# Patient Record
Sex: Male | Born: 1985 | Race: White | Hispanic: No | Marital: Married | Smoking: Current every day smoker
Health system: Southern US, Community
[De-identification: ages and names within clinical notes are randomized; demographics above are authoritative.]

## PROBLEM LIST (undated history)

## (undated) DIAGNOSIS — J45909 Unspecified asthma, uncomplicated: Secondary | ICD-10-CM

## (undated) HISTORY — PX: NO PAST SURGERIES: SHX2092

---
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
## 2008-12-28 ENCOUNTER — Emergency Department (HOSPITAL_COMMUNITY): Admission: EM | Admit: 2008-12-28 | Discharge: 2008-12-28 | Payer: Self-pay | Admitting: Emergency Medicine

## 2010-05-19 LAB — COMPREHENSIVE METABOLIC PANEL
AST: 88 U/L — ABNORMAL HIGH (ref 0–37)
Albumin: 3.5 g/dL (ref 3.5–5.2)
Alkaline Phosphatase: 90 U/L (ref 39–117)
BUN: 13 mg/dL (ref 6–23)
CO2: 24 mEq/L (ref 19–32)
Chloride: 98 mEq/L (ref 96–112)
Creatinine, Ser: 1.11 mg/dL (ref 0.4–1.5)
GFR calc non Af Amer: >60 mL/min (ref >60)
Potassium: 3.1 mEq/L — ABNORMAL LOW (ref 3.5–5.1)
Total Bilirubin: 0.9 mg/dL (ref 0.3–1.2)

## 2010-05-19 LAB — URINALYSIS, ROUTINE W REFLEX MICROSCOPIC
Leukocytes, UA: NEGATIVE
Nitrite: NEGATIVE
Protein, ur: >300 mg/dL — AB
Urobilinogen, UA: 1 mg/dL (ref 0.0–1.0)

## 2010-05-19 LAB — DIFFERENTIAL
Basophils Absolute: 0.1 10*3/uL (ref 0.0–0.1)
Basophils Relative: 2 % — ABNORMAL HIGH (ref 0–1)
Eosinophils Relative: 0 % (ref 0–5)
Lymphocytes Relative: 13 % (ref 12–46)
Monocytes Absolute: 0.3 10*3/uL (ref 0.1–1.0)
Neutro Abs: 4.1 10*3/uL (ref 1.7–7.7)

## 2010-05-19 LAB — CBC
HCT: 41.9 % (ref 39.0–52.0)
MCV: 85.2 fL (ref 78.0–100.0)
Platelets: 137 10*3/uL — ABNORMAL LOW (ref 150–400)
RBC: 4.92 MIL/uL (ref 4.22–5.81)
WBC: 5.1 10*3/uL (ref 4.0–10.5)

## 2010-05-19 LAB — RAPID STREP SCREEN (MED CTR MEBANE ONLY): Streptococcus, Group A Screen (Direct): NEGATIVE

## 2010-05-19 LAB — URINE MICROSCOPIC-ADD ON

## 2010-05-19 LAB — URINE CULTURE

## 2020-06-01 ENCOUNTER — Emergency Department (HOSPITAL_COMMUNITY): Payer: Self-pay

## 2020-06-01 ENCOUNTER — Encounter (HOSPITAL_COMMUNITY): Payer: Self-pay

## 2020-06-01 ENCOUNTER — Emergency Department (HOSPITAL_COMMUNITY)
Admission: EM | Admit: 2020-06-01 | Discharge: 2020-06-01 | Disposition: A | Discharge status: Home / Self Care | Payer: Self-pay | Attending: Emergency Medicine | Admitting: Emergency Medicine

## 2020-06-01 DIAGNOSIS — R000 Tachycardia, unspecified: Secondary | ICD-10-CM | POA: Insufficient documentation

## 2020-06-01 DIAGNOSIS — L089 Local infection of the skin and subcutaneous tissue, unspecified: Secondary | ICD-10-CM | POA: Insufficient documentation

## 2020-06-01 DIAGNOSIS — M7989 Other specified soft tissue disorders: Secondary | ICD-10-CM | POA: Insufficient documentation

## 2020-06-01 DIAGNOSIS — F17200 Nicotine dependence, unspecified, uncomplicated: Secondary | ICD-10-CM | POA: Insufficient documentation

## 2020-06-01 LAB — CBC WITH DIFFERENTIAL/PLATELET
Abs Immature Granulocytes: 0.02 10*3/uL (ref 0.00–0.07)
Basophils Absolute: 0 10*3/uL (ref 0.0–0.1)
Basophils Relative: 0 %
Eosinophils Absolute: 0.1 10*3/uL (ref 0.0–0.5)
Eosinophils Relative: 1 %
HCT: 43 % (ref 39.0–52.0)
Hemoglobin: 14.5 g/dL (ref 13.0–17.0)
Immature Granulocytes: 0 %
Lymphocytes Relative: 23 %
Lymphs Abs: 2.2 10*3/uL (ref 0.7–4.0)
MCH: 29.2 pg (ref 26.0–34.0)
MCHC: 33.7 g/dL (ref 30.0–36.0)
MCV: 86.5 fL (ref 80.0–100.0)
Monocytes Absolute: 0.6 10*3/uL (ref 0.1–1.0)
Monocytes Relative: 6 %
Neutro Abs: 6.7 10*3/uL (ref 1.7–7.7)
Neutrophils Relative %: 70 %
Platelets: 252 10*3/uL (ref 150–400)
RBC: 4.97 MIL/uL (ref 4.22–5.81)
RDW: 14.2 % (ref 11.5–15.5)
WBC: 9.6 10*3/uL (ref 4.0–10.5)
nRBC: 0 % (ref 0.0–0.2)

## 2020-06-01 LAB — BASIC METABOLIC PANEL
Anion gap: 10 (ref 5–15)
BUN: 10 mg/dL (ref 6–20)
CO2: 24 mmol/L (ref 22–32)
Calcium: 8.9 mg/dL (ref 8.9–10.3)
Chloride: 103 mmol/L (ref 98–111)
Creatinine, Ser: 0.85 mg/dL (ref 0.61–1.24)
GFR, Estimated: >60 mL/min (ref >60)
Glucose, Bld: 112 mg/dL — ABNORMAL HIGH (ref 70–99)
Potassium: 4.1 mmol/L (ref 3.5–5.1)
Sodium: 137 mmol/L (ref 135–145)

## 2020-06-01 LAB — LACTIC ACID, PLASMA: Lactic Acid, Venous: 1.6 mmol/L (ref 0.5–1.9)

## 2020-06-01 LAB — SEDIMENTATION RATE: Sed Rate: 22 mm/hr — ABNORMAL HIGH (ref 0–16)

## 2020-06-01 LAB — C-REACTIVE PROTEIN: CRP: 8.4 mg/dL — ABNORMAL HIGH (ref <1.0)

## 2020-06-01 MED ORDER — CEFAZOLIN SODIUM-DEXTROSE 2-4 GM/100ML-% IV SOLN
2.0000 g | Freq: Once | INTRAVENOUS | Status: AC
  Administered 2020-06-01: 2 g via INTRAVENOUS
  Filled 2020-06-01 (×2): qty 100

## 2020-06-01 MED ORDER — CEPHALEXIN 500 MG PO CAPS: 500.0000 mg | ORAL_CAPSULE | Freq: Four times a day (QID) | ORAL | 0 refills | Status: AC

## 2020-06-01 NOTE — ED Notes (Signed)
Per Tanda Rockers, PA second lactic blood draw not needed.

## 2020-06-01 NOTE — ED Triage Notes (Signed)
Pt presents with c/o cut on his right index finger that opened up last night. Pt reports he originally got the cut approx week ago. Pt reports that he woke up this morning and his right hand is swollen.

## 2020-06-01 NOTE — ED Provider Notes (Signed)
[facility]-EMERGENCY DEPT Provider Note   CSN: [id number] Arrival date & time: 06/01/20  1201     History Chief Complaint  Patient presents with  . Hand swelling    Daniel Galvan is a 35 y.o. male who presents to the ED today with complaint of gradual onset, constant, worsening, right index finger swelling that he noticed this morning.  She reports that about 1 week ago he accidentally cut his right index finger on a glass bottle.  He states that the wound scabbed over and he did not think much of it however yesterday he hit his hand against a door and opened up the wound.  He states that it was bleeding slightly.  When he woke up this morning he had diffuse swelling to the finger going into the hand with redness.  He states that the wound was draining a small amount of yellow material however he is unsure if it was purulent or thin the yellow and drainage.  He denies any fevers or chills.  He denies any history of diabetes.  He has no known medical problems.   The history is provided by the patient and medical records.       History reviewed. No pertinent past medical history.  There are no problems to display for this patient.   History reviewed. No pertinent surgical history.     History reviewed. No pertinent family history.  Social History   Tobacco Use  . Smoking status: Current Every Day Smoker  . Smokeless tobacco: Never Used  Substance Use Topics  . Alcohol use: Yes    Comment: rarely     Home Medications Prior to Admission medications   Medication Sig Start Date End Date Taking? Authorizing Provider  cephALEXin (KEFLEX) 500 MG capsule Take 1 capsule (500 mg total) by mouth 4 (four) times daily for 7 days. 06/01/20 06/08/20 Yes Ravi Tuccillo, PA-C    Allergies    Patient has no known allergies.  Review of Systems   Review of Systems  Constitutional: Negative for chills and fever.  Musculoskeletal: Positive for arthralgias.  Skin:  Positive for color change and wound.  All other systems reviewed and are negative.   Physical Exam Updated Vital Signs BP (!) 142/109 (BP Location: Left Arm)   Pulse (!) 105   Temp 99 F (37.2 C) (Oral)   Resp 18   Ht 6\' 2"  (1.88 m)   Wt 124.7 kg   SpO2 98%   BMI 35.31 kg/m   Physical Exam Vitals and nursing note reviewed.  Constitutional:      Appearance: He is not ill-appearing.  HENT:     Head: Normocephalic and atraumatic.  Eyes:     Conjunctiva/sclera: Conjunctivae normal.  Cardiovascular:     Rate and Rhythm: Regular rhythm. Tachycardia present.  Pulmonary:     Effort: Pulmonary effort is normal.     Breath sounds: Normal breath sounds. No wheezing, rhonchi or rales.  Abdominal:     Palpations: Abdomen is soft.     Tenderness: There is no abdominal tenderness.  Musculoskeletal:     Cervical back: Neck supple.     Comments: See photo below. 1 x 1 cm open wound to the PIP joint of the right index finger with purulent material. Erythema surrounding the wound with fusiform edema to the index finger extending into the hand. ROM limited with flexion and extension of the entire finger s/2 swelling. Cap refill < 2 seconds. 2+ radial pulse.  Skin:    General: Skin is warm and dry.  Neurological:     Mental Status: He is alert.        ED Results / Procedures / Treatments   Labs (all labs ordered are listed, but only abnormal results are displayed) Labs Reviewed  BASIC METABOLIC PANEL - Abnormal; Notable for the following components:      Result Value   Glucose, Bld 112 (*)    All other components within normal limits  SEDIMENTATION RATE - Abnormal; Notable for the following components:   Sed Rate 22 (*)    All other components within normal limits  C-REACTIVE PROTEIN - Abnormal; Notable for the following components:   CRP 8.4 (*)    All other components within normal limits  CBC WITH DIFFERENTIAL/PLATELET  LACTIC ACID, PLASMA    EKG None  Radiology DG  Finger Index Right  Result Date: 06/01/2020 CLINICAL DATA:  Laceration with swelling and drainage. Injury 1 week ago. EXAM: RIGHT INDEX FINGER 2+V COMPARISON:  None. FINDINGS: Pronounced dorsal soft tissue swelling particularly in the region of the PIP joint. No sign of radiopaque foreign object. No bone abnormality visible. IMPRESSION: Pronounced dorsal soft tissue swelling particularly in the region of the PIP joint. No sign of radiopaque foreign object or fracture. Electronically Signed   By: Paulina Fusi M.D.   On: 06/01/2020 13:00    Procedures Procedures   Medications Ordered in ED Medications  ceFAZolin (ANCEF) IVPB 2g/100 mL premix (2 g Intravenous New Bag/Given 06/01/20 1429)    ED Course  I have reviewed the triage vital signs and the nursing notes.  Pertinent labs & imaging results that were available during my care of the patient were reviewed by me and considered in my medical decision making (see chart for details).  Clinical Course as of 06/01/20 1645  Mon Jun 01, 2020  1335 Discussed case with Earney Hamburg, PA-C, who recommends irrigating wounds and provided IV ancef 2 grams with Rx abx and follow up with Dr. Melvyn Novas in the office this week. Recommends splint for comfort.  [MV]    Clinical Course User Index [MV] Tanda Rockers, PA-C   MDM Rules/Calculators/A&P                          35 year old male presents to the ED today with complaint of swelling, pain, wound to his right index finger.  Sustained a laceration 1 week ago however hit his hand on a door yesterday opening the wound back open and now having swelling and drainage to the area.  On arrival to the ED patient's temperature is 9.0.  He is mildly tachycardic at 105, nontachypneic.  He is nontoxic appearing.  He has a 1 x 1 cm open wound to the PIP joint of the right index finger dorsally.  He has fusiform swelling to the finger with limited range of motion.  He has tenderness throughout  Concern for possible  flexor tenosynovitis at this time.  Will obtain labs as well as x-ray.  Will need to consult hand surgery for further recommendations.  Xray: IMPRESSION:  Pronounced dorsal soft tissue swelling particularly in the region of  the PIP joint. No sign of radiopaque foreign object or fracture.    CBC without leukocytosis 9.6 BMP without electrolyte abnormalities Lactic acid within normal limits at 1.6 Sed rate 22 CRP 8.4  Wound has been irrigated thoroughly by nursing staff per my recommendations. Pt received IV ancef  and splint applied. Will discharge home at this time with oral abx and close follow up with Dr. Melvyn Novas. Pt is in agreement with plan and stable for discharge home.   This note was prepared using Dragon voice recognition software and may include unintentional dictation errors due to the inherent limitations of voice recognition software.    Final Clinical Impression(s) / ED Diagnoses Final diagnoses:  Finger infection    Rx / DC Orders ED Discharge Orders         Ordered    cephALEXin (KEFLEX) 500 MG capsule  4 times daily        06/01/20 1644           Discharge Instructions     Please pick up antibiotics and take as prescribed to cover for your infection. Call Dr. Glenna Durand office tomorrow morning to schedule an appointment for further evaluation. Wear splint in the meantime.   Take Ibuprofen and Tylenol as needed for pain.   Return to the ED for any worsening symptoms       Tanda Rockers, PA-C 06/01/20 1645    Derwood Kaplan, MD 06/02/20 1733

## 2020-06-01 NOTE — Progress Notes (Signed)
Orthopedic Tech Progress Note Patient Details:  OMER PUCCINELLI 1985/11/28 [id number]  Ortho Devices Type of Ortho Device: Finger splint Ortho Device/Splint Location: right Ortho Device/Splint Interventions: Application   Post Interventions Patient Tolerated: Well Instructions Provided: Care of device   Saul Fordyce 06/01/2020, 4:56 PM

## 2020-06-01 NOTE — ED Notes (Signed)
An After Visit Summary was printed and given to the patient. Discharge instructions given and no further questions at this time.  

## 2020-06-01 NOTE — ED Notes (Signed)
Wound to right hand has been irrigated per Tanda Rockers, PA.

## 2020-06-01 NOTE — Discharge Instructions (Signed)
Please pick up antibiotics and take as prescribed to cover for your infection. Call Dr. Glenna Durand office tomorrow morning to schedule an appointment for further evaluation. Wear splint in the meantime.   Take Ibuprofen and Tylenol as needed for pain.   Return to the ED for any worsening symptoms

## 2020-07-28 ENCOUNTER — Other Ambulatory Visit: Payer: Self-pay

## 2020-07-28 ENCOUNTER — Encounter (HOSPITAL_COMMUNITY): Payer: Self-pay | Admitting: Orthopedic Surgery

## 2020-07-28 NOTE — Progress Notes (Signed)
Mr. Daniel Galvan denies chest pain or shortness of breath.  Patient denies s/s of Covid for himself or house hold. [name] denies being in contact with anyone with Covid to his knowledge.  When Mr. Daniel Galvan was in the ED 4/ /22 his blood pressure was documented at 142/109. Mr. Daniel Galvan reports that he has been told in the past that it was a little high, never started on medication, patient does not have a PCP.

## 2020-07-29 ENCOUNTER — Encounter (HOSPITAL_COMMUNITY)
Admission: RE | Disposition: A | Discharge status: Home / Self Care | Payer: Self-pay | Source: Home / Self Care | Attending: Orthopedic Surgery

## 2020-07-29 ENCOUNTER — Ambulatory Visit (HOSPITAL_COMMUNITY): Payer: Self-pay | Admitting: Anesthesiology

## 2020-07-29 ENCOUNTER — Ambulatory Visit (HOSPITAL_COMMUNITY)
Admission: RE | Admit: 2020-07-29 | Discharge: 2020-07-29 | Disposition: A | Discharge status: Home / Self Care | Payer: Self-pay | Attending: Orthopedic Surgery | Admitting: Orthopedic Surgery

## 2020-07-29 ENCOUNTER — Encounter (HOSPITAL_COMMUNITY): Payer: Self-pay | Admitting: Orthopedic Surgery

## 2020-07-29 DIAGNOSIS — F17200 Nicotine dependence, unspecified, uncomplicated: Secondary | ICD-10-CM | POA: Insufficient documentation

## 2020-07-29 DIAGNOSIS — M869 Osteomyelitis, unspecified: Secondary | ICD-10-CM | POA: Insufficient documentation

## 2020-07-29 HISTORY — DX: Unspecified asthma, uncomplicated: J45.909

## 2020-07-29 HISTORY — PX: INCISION AND DRAINAGE: SHX5863

## 2020-07-29 LAB — POCT I-STAT, CHEM 8
BUN: 12 mg/dL (ref 6–20)
Calcium, Ion: 1.09 mmol/L — ABNORMAL LOW (ref 1.15–1.40)
Chloride: 106 mmol/L (ref 98–111)
Creatinine, Ser: 0.8 mg/dL (ref 0.61–1.24)
Glucose, Bld: 96 mg/dL (ref 70–99)
HCT: 40 % (ref 39.0–52.0)
Hemoglobin: 13.6 g/dL (ref 13.0–17.0)
Potassium: 5.6 mmol/L — ABNORMAL HIGH (ref 3.5–5.1)
Sodium: 138 mmol/L (ref 135–145)
TCO2: 26 mmol/L (ref 22–32)

## 2020-07-29 SURGERY — INCISION AND DRAINAGE
Anesthesia: General | Laterality: Right

## 2020-07-29 MED ORDER — FENTANYL CITRATE (PF) 100 MCG/2ML IJ SOLN: 25.0000 ug | INTRAMUSCULAR | Status: DC | PRN

## 2020-07-29 MED ORDER — ORAL CARE MOUTH RINSE: 15.0000 mL | Freq: Once | OROMUCOSAL | Status: AC

## 2020-07-29 MED ORDER — MIDAZOLAM HCL 2 MG/2ML IJ SOLN
INTRAMUSCULAR | Status: AC
  Filled 2020-07-29: qty 2

## 2020-07-29 MED ORDER — MIDAZOLAM HCL 5 MG/5ML IJ SOLN
INTRAMUSCULAR | Status: DC | PRN
  Administered 2020-07-29: 2 mg via INTRAVENOUS

## 2020-07-29 MED ORDER — ONDANSETRON HCL 4 MG/2ML IJ SOLN
INTRAMUSCULAR | Status: DC | PRN
  Administered 2020-07-29: 4 mg via INTRAVENOUS

## 2020-07-29 MED ORDER — PROPOFOL 10 MG/ML IV BOLUS
INTRAVENOUS | Status: AC
  Filled 2020-07-29: qty 20

## 2020-07-29 MED ORDER — LIDOCAINE HCL (CARDIAC) PF 100 MG/5ML IV SOSY
PREFILLED_SYRINGE | INTRAVENOUS | Status: DC | PRN
  Administered 2020-07-29: 60 mg via INTRAVENOUS

## 2020-07-29 MED ORDER — FENTANYL CITRATE (PF) 250 MCG/5ML IJ SOLN
INTRAMUSCULAR | Status: AC
  Filled 2020-07-29: qty 5

## 2020-07-29 MED ORDER — LIDOCAINE HCL (PF) 2 % IJ SOLN
INTRAMUSCULAR | Status: AC
  Filled 2020-07-29: qty 5

## 2020-07-29 MED ORDER — LACTATED RINGERS IV SOLN: INTRAVENOUS | Status: DC

## 2020-07-29 MED ORDER — ONDANSETRON HCL 4 MG/2ML IJ SOLN
INTRAMUSCULAR | Status: AC
  Filled 2020-07-29: qty 2

## 2020-07-29 MED ORDER — DEXAMETHASONE SODIUM PHOSPHATE 10 MG/ML IJ SOLN
INTRAMUSCULAR | Status: AC
  Filled 2020-07-29: qty 1

## 2020-07-29 MED ORDER — OXYCODONE HCL 5 MG PO TABS: 5.0000 mg | ORAL_TABLET | Freq: Once | ORAL | Status: DC | PRN

## 2020-07-29 MED ORDER — OXYCODONE HCL 5 MG/5ML PO SOLN: 5.0000 mg | Freq: Once | ORAL | Status: DC | PRN

## 2020-07-29 MED ORDER — BUPIVACAINE HCL (PF) 0.25 % IJ SOLN
INTRAMUSCULAR | Status: AC
  Filled 2020-07-29: qty 10

## 2020-07-29 MED ORDER — ONDANSETRON HCL 4 MG/2ML IJ SOLN: 4.0000 mg | Freq: Once | INTRAMUSCULAR | Status: DC | PRN

## 2020-07-29 MED ORDER — FENTANYL CITRATE (PF) 250 MCG/5ML IJ SOLN
INTRAMUSCULAR | Status: DC | PRN
  Administered 2020-07-29: 50 ug via INTRAVENOUS
  Administered 2020-07-29: 100 ug via INTRAVENOUS

## 2020-07-29 MED ORDER — DEXAMETHASONE SODIUM PHOSPHATE 10 MG/ML IJ SOLN
INTRAMUSCULAR | Status: DC | PRN
  Administered 2020-07-29: 4 mg via INTRAVENOUS

## 2020-07-29 MED ORDER — CHLORHEXIDINE GLUCONATE 0.12 % MT SOLN
OROMUCOSAL | Status: AC
  Administered 2020-07-29: 15 mL
  Filled 2020-07-29: qty 15

## 2020-07-29 MED ORDER — CHLORHEXIDINE GLUCONATE 0.12 % MT SOLN: 15.0000 mL | Freq: Once | OROMUCOSAL | Status: AC

## 2020-07-29 MED ORDER — PROPOFOL 10 MG/ML IV BOLUS
INTRAVENOUS | Status: DC | PRN
  Administered 2020-07-29: 200 mg via INTRAVENOUS

## 2020-07-29 MED ORDER — BUPIVACAINE HCL (PF) 0.25 % IJ SOLN
INTRAMUSCULAR | Status: DC | PRN
  Administered 2020-07-29: 9 mL

## 2020-07-29 MED ORDER — CEFAZOLIN IN SODIUM CHLORIDE 3-0.9 GM/100ML-% IV SOLN
3.0000 g | INTRAVENOUS | Status: DC
  Filled 2020-07-29: qty 100

## 2020-07-29 MED ORDER — SODIUM CHLORIDE 0.9 % IR SOLN
Status: DC | PRN
  Administered 2020-07-29: 3000 mL

## 2020-07-29 SURGICAL SUPPLY — 55 items
BNDG COHESIVE 1X5 TAN STRL LF (GAUZE/BANDAGES/DRESSINGS) ×3 IMPLANT
BNDG CONFORM 2 STRL LF (GAUZE/BANDAGES/DRESSINGS) ×3 IMPLANT
BNDG ELASTIC 3X5.8 VLCR STR LF (GAUZE/BANDAGES/DRESSINGS) ×3 IMPLANT
BNDG ELASTIC 4X5.8 VLCR STR LF (GAUZE/BANDAGES/DRESSINGS) ×3 IMPLANT
BNDG ESMARK 4X9 LF (GAUZE/BANDAGES/DRESSINGS) ×3 IMPLANT
BNDG GAUZE ELAST 4 BULKY (GAUZE/BANDAGES/DRESSINGS) ×3 IMPLANT
CORD BIPOLAR FORCEPS 12FT (ELECTRODE) ×3 IMPLANT
COVER SURGICAL LIGHT HANDLE (MISCELLANEOUS) ×3 IMPLANT
COVER WAND RF STERILE (DRAPES) ×3 IMPLANT
CUFF TOURN SGL QUICK 18X4 (TOURNIQUET CUFF) ×3 IMPLANT
CUFF TOURN SGL QUICK 24 (TOURNIQUET CUFF)
CUFF TRNQT CYL 24X4X16.5-23 (TOURNIQUET CUFF) IMPLANT
DRAIN PENROSE 1/4X12 LTX STRL (WOUND CARE) IMPLANT
DRAPE SURG 17X23 STRL (DRAPES) ×3 IMPLANT
DRSG ADAPTIC 3X8 NADH LF (GAUZE/BANDAGES/DRESSINGS) ×3 IMPLANT
ELECT REM PT RETURN 9FT ADLT (ELECTROSURGICAL)
ELECTRODE REM PT RTRN 9FT ADLT (ELECTROSURGICAL) IMPLANT
GAUZE SPONGE 4X4 12PLY STRL (GAUZE/BANDAGES/DRESSINGS) ×3 IMPLANT
GAUZE XEROFORM 1X8 LF (GAUZE/BANDAGES/DRESSINGS) ×3 IMPLANT
GAUZE XEROFORM 5X9 LF (GAUZE/BANDAGES/DRESSINGS) IMPLANT
GLOVE BIOGEL PI IND STRL 8.5 (GLOVE) ×1 IMPLANT
GLOVE BIOGEL PI INDICATOR 8.5 (GLOVE) ×2
GLOVE SURG ORTHO 8.0 STRL STRW (GLOVE) ×3 IMPLANT
GOWN STRL REUS W/ TWL LRG LVL3 (GOWN DISPOSABLE) ×3 IMPLANT
GOWN STRL REUS W/ TWL XL LVL3 (GOWN DISPOSABLE) ×1 IMPLANT
GOWN STRL REUS W/TWL LRG LVL3 (GOWN DISPOSABLE) ×6
GOWN STRL REUS W/TWL XL LVL3 (GOWN DISPOSABLE) ×3
HANDPIECE INTERPULSE COAX TIP (DISPOSABLE)
KIT BASIN OR (CUSTOM PROCEDURE TRAY) ×3 IMPLANT
KIT TURNOVER KIT B (KITS) ×3 IMPLANT
MANIFOLD NEPTUNE II (INSTRUMENTS) ×3 IMPLANT
NEEDLE HYPO 25GX1X1/2 BEV (NEEDLE) IMPLANT
NS IRRIG 1000ML POUR BTL (IV SOLUTION) ×3 IMPLANT
PACK ORTHO EXTREMITY (CUSTOM PROCEDURE TRAY) ×3 IMPLANT
PAD ARMBOARD 7.5X6 YLW CONV (MISCELLANEOUS) ×6 IMPLANT
PAD CAST 4YDX4 CTTN HI CHSV (CAST SUPPLIES) ×1 IMPLANT
PADDING CAST COTTON 4X4 STRL (CAST SUPPLIES) ×3
SET CYSTO W/LG BORE CLAMP LF (SET/KITS/TRAYS/PACK) IMPLANT
SET HNDPC FAN SPRY TIP SCT (DISPOSABLE) IMPLANT
SOAP 2 % CHG 4 OZ (WOUND CARE) ×3 IMPLANT
SPONGE LAP 18X18 RF (DISPOSABLE) ×3 IMPLANT
SPONGE LAP 4X18 RFD (DISPOSABLE) ×3 IMPLANT
SUT ETHILON 4 0 PS 2 18 (SUTURE) IMPLANT
SUT ETHILON 5 0 P 3 18 (SUTURE)
SUT NYLON ETHILON 5-0 P-3 1X18 (SUTURE) IMPLANT
SWAB COLLECTION DEVICE MRSA (MISCELLANEOUS) ×3 IMPLANT
SWAB CULTURE ESWAB REG 1ML (MISCELLANEOUS) IMPLANT
SYR CONTROL 10ML LL (SYRINGE) IMPLANT
TOWEL GREEN STERILE (TOWEL DISPOSABLE) ×3 IMPLANT
TOWEL GREEN STERILE FF (TOWEL DISPOSABLE) ×3 IMPLANT
TUBE CONNECTING 12'X1/4 (SUCTIONS) ×1
TUBE CONNECTING 12X1/4 (SUCTIONS) ×2 IMPLANT
UNDERPAD 30X36 HEAVY ABSORB (UNDERPADS AND DIAPERS) ×3 IMPLANT
WATER STERILE IRR 1000ML POUR (IV SOLUTION) ×3 IMPLANT
YANKAUER SUCT BULB TIP NO VENT (SUCTIONS) ×3 IMPLANT

## 2020-07-29 NOTE — Anesthesia Preprocedure Evaluation (Signed)
Anesthesia Evaluation  Patient identified by MRN, date of birth, ID band Patient awake    Reviewed: Allergy & Precautions, NPO status , Patient's Chart, lab work & pertinent test results  Airway Mallampati: II  TM Distance: >3 FB Neck ROM: Full    Dental  (+) Teeth Intact, Dental Advisory Given   Pulmonary Current Smoker and Patient abstained from smoking.,    breath sounds clear to auscultation       Cardiovascular  Rhythm:Regular Rate:Normal     Neuro/Psych    GI/Hepatic   Endo/Other    Renal/GU      Musculoskeletal   Abdominal   Peds  Hematology   Anesthesia Other Findings   Reproductive/Obstetrics                             Anesthesia Physical Anesthesia Plan  ASA: 2  Anesthesia Plan: General   Post-op Pain Management:    Induction: Intravenous  PONV Risk Score and Plan: Ondansetron and Dexamethasone  Airway Management Planned: LMA  Additional Equipment:   Intra-op Plan:   Post-operative Plan: Extubation in OR  Informed Consent: I have reviewed the patients History and Physical, chart, labs and discussed the procedure including the risks, benefits and alternatives for the proposed anesthesia with the patient or authorized representative who has indicated his/her understanding and acceptance.     Dental advisory given  Plan Discussed with: CRNA and Anesthesiologist  Anesthesia Plan Comments:         Anesthesia Quick Evaluation

## 2020-07-29 NOTE — Op Note (Signed)
PREOPERATIVE DIAGNOSIS: Right index finger proximal interphalangeal joint infection  POSTOPERATIVE DIAGNOSIS: Same  ATTENDING SURGEON: Dr. Bradly Bienenstock who scrubbed and present for the entire procedure  ASSISTANT SURGEON: Lambert Mody, [facility] who scrubbed in necessary for exposure debridement closure and splinting in a timely fashion  ANESTHESIA: General via LMA  OPERATIVE PROCEDURE: #1: Open treatment of right index finger PIP joint infection joint arthrotomy and debridement #2: Right index finger debridement of bone bone curettage proximal middle phalangeal  IMPLANTS: None  EBL: Minimal  RADIOGRAPHIC INTERPRETATION: None  SURGICAL INDICATIONS: Patient is a right-hand-dominant gentleman who was referred from the ER due to persistent infection.  Patient is followed in the clinic endorsing approximately continue to progress to involve the PIP joint.  Patient was seen and evaluated and recommended undergo the above procedure.  Risks of surgery include not limited to bleeding infection damage nearby nerves arteries or tendons loss of motion of the wrist and digits incomplete relief of symptoms and need for further surgical invention.  SURGICAL TECHNIQUE: Patient was palpated find the preoperative holding area marked, marker made on the right index finger MP direct operative site.  Patient brought back to operating placed supine on anesthesia table where general anesthetic was administered.  Patient tolerates well.  Well-padded tourniquet placed on the right brachium seems appropriate drape.  Preoperative antibiotics were held prior to wound cultures.  The right upper extremity prepped and draped in normal sterile fashion.  A timeout was called the correct site identified procedure then begun.  Attention was then turned to the right index finger.  Limb was then elevated tourniquet insufflated.  Longitudinal incision made directly over the dorsal aspect of the index finger PIP joint.  Dissection  carried down through the skin and subcutaneous tissue.  The extensor interval was in continuity and this was incised longitudinally exposing the joint.  Joint arthrotomy was then carried out and debridement of the joint was then carried out synovectomy was carried out within the proximal interphalangeal joint with loss of the articular cartilage on both surfaces of the joint.  Patient did have the instability based on the loss of the joint surfaces to the PIP joint which appeared to be from a septic arthritis.  Debridement of the bone was then carried out the bone fragments were then sent for culture of the proximal and middle phalanges.  The joint was then thoroughly irrigated.  After synovectomy and bone.  Time and joint irrigation the extensor was loosely reapproximated with Monocryl suture.  Skin was then closed using simple Prolene suture.  Adaptic dressing sterile compressive bandage then applied.  Patient tolerated procedure well.  Patient placed in a small finger splint extubated taken recovery room in good condition.  POSTOPERATIVE PLAN: Patient be discharged home.  See him back in the office as an outpatient for wound check.  We will consult infectious disease about the treatment of the osteomyelitis or septic arthritis and long-term IV oral antibiotics.  Patient is likely going to need an arthrodesis of the PIP joint once the infection is cleared.  He had heals.  Patient does not have any articular cartilage on the proximal phalanx and very little in the middle phalanx.

## 2020-07-29 NOTE — Transfer of Care (Signed)
Immediate Anesthesia Transfer of Care Note  Patient: Daniel Galvan  Procedure(s) Performed: Right index finger incision and drainage and bone curettage (Right)  Patient Location: PACU  Anesthesia Type:General  Level of Consciousness: sedated  Airway & Oxygen Therapy: Patient Spontanous Breathing  Post-op Assessment: Report given to RN  Post vital signs: stable  Last Vitals:  Vitals Value Taken Time  BP 135/75 07/29/20 1436  Temp    Pulse 77 07/29/20 1437  Resp 23 07/29/20 1437  SpO2 94 % 07/29/20 1437  Vitals shown include unvalidated device data.  Last Pain:  Vitals:   07/29/20 1119  TempSrc:   PainSc: 0-No pain         Complications: No notable events documented.

## 2020-07-29 NOTE — H&P (Signed)
  Daniel Galvan is an 35 y.o. male.   Chief Complaint: RIGHT INDEX FINGER PAIN  HPI: The patient is a 35 y/o right hand dominance male show sustained a laceration to the right index finger approximately 8 weeks ago. An incision and drainage of the finger was completed on 06/04/20 and he was given antibiotics. During the post-operative healing, he continued to have swelling and deviation of the finger. Another course of antibiotics was completed with minimal improvement.  Due to the continued pain, weakness, numbness, swelling, and erythema, we discussed the reason and rationale for surgical intervention.  He is here today for surgery.  He denies chest pain, shortness of breath, fever, chills, nausea, vomiting, or diarrhea.    Past Medical History:  Diagnosis Date   Asthma     Past Surgical History:  Procedure Laterality Date   NO PAST SURGERIES      No family history on file. Social History:  reports that he has been smoking cigarettes. He has a 3.90 pack-year smoking history. He has never used smokeless tobacco. He reports current alcohol use. He reports previous drug use.  Allergies: No Known Allergies  No medications prior to admission.    No results found for this or any previous visit (from the past 48 hour(s)). No results found.  ROS NO RECENT ILLNESSES OR HOSPITALIZATIONS  Height 6\' 2"  (1.88 m), weight 124.7 kg. Physical Exam  General Appearance:  Alert, cooperative, no distress, appears stated age  Head:  Normocephalic, without obvious abnormality, atraumatic  Eyes:  Pupils equal, conjunctiva/corneas clear,         Throat: Lips, mucosa, and tongue normal; teeth and gums normal  Neck: No visible masses     Lungs:   respirations unlabored  Chest Wall:  No tenderness or deformity  Heart:  Regular rate and rhythm,  Abdomen:   Soft, non-tender,         Extremities: Right index large swelling over pip joint, finger warm well perfused Good cap refil limited motion  at pip joint  Pulses: 2+ and symmetric  Skin: Skin color, texture, turgor normal, no rashes or lesions     Neurologic: Normal     Assessment/Plan RIGHT INDEX FINGER OSTEOMYELITIS   - RIGHT INDEX FINGER INCISION AND DRAINAGE AND BONE CURRETAGE   WE ARE PLANNING SURGERY FOR YOUR UPPER EXTREMITY. THE RISKS AND BENEFITS OF SURGERY INCLUDE BUT NOT LIMITED TO BLEEDING INFECTION, DAMAGE TO NEARBY NERVES ARTERIES TENDONS, FAILURE OF SURGERY TO ACCOMPLISH ITS INTENDED GOALS, PERSISTENT SYMPTOMS AND NEED FOR FURTHER SURGICAL INTERVENTION. WITH THIS IN MIND WE WILL PROCEED. I HAVE DISCUSSED WITH THE PATIENT THE PRE AND POSTOPERATIVE REGIMEN AND THE DOS AND DON'TS. PT VOICED UNDERSTANDING AND INFORMED CONSENT SIGNED.   R/B/A DISCUSSED WITH PT IN OFFICE.  PT VOICED UNDERSTANDING OF PLAN CONSENT SIGNED DAY OF SURGERY PT SEEN AND EXAMINED PRIOR TO OPERATIVE PROCEDURE/DAY OF SURGERY SITE MARKED. QUESTIONS ANSWERED WILL GO HOME FOLLOWING SURGERY   Halee Glynn [facility] 07/29/20  07/31/20 07/29/2020, 10:11 AM

## 2020-07-29 NOTE — Anesthesia Procedure Notes (Signed)
Procedure Name: LMA Insertion Date/Time: 07/29/2020 1:54 PM Performed by: Oliva Bustard, CRNA Pre-anesthesia Checklist: Patient identified, Patient being monitored, Timeout performed, Emergency Drugs available and Suction available Patient Re-evaluated:Patient Re-evaluated prior to induction Oxygen Delivery Method: Circle system utilized Preoxygenation: Pre-oxygenation with 100% oxygen Induction Type: IV induction Ventilation: Mask ventilation without difficulty LMA: LMA inserted LMA Size: 5.0 Tube type: Oral Number of attempts: 1 Placement Confirmation: positive ETCO2 and breath sounds checked- equal and bilateral Tube secured with: Tape Dental Injury: Teeth and Oropharynx as per pre-operative assessment

## 2020-07-29 NOTE — Anesthesia Postprocedure Evaluation (Signed)
Anesthesia Post Note  Patient: Daniel Galvan  Procedure(s) Performed: Right index finger incision and drainage and bone curettage (Right)     Patient location during evaluation: PACU Anesthesia Type: General Level of consciousness: awake and alert Pain management: pain level controlled Vital Signs Assessment: post-procedure vital signs reviewed and stable Respiratory status: spontaneous breathing, nonlabored ventilation, respiratory function stable and patient connected to nasal cannula oxygen Cardiovascular status: blood pressure returned to baseline and stable Postop Assessment: no apparent nausea or vomiting Anesthetic complications: no   No notable events documented.  Last Vitals:  Vitals:   07/29/20 1451 07/29/20 1506  BP: (!) 147/91 131/71  Pulse: 78 69  Resp: (!) 23 20  Temp:  (!) 36.3 C  SpO2: 98% 97%    Last Pain:  Vitals:   07/29/20 1506  TempSrc:   PainSc: 4                  Kaylee Trivett COKER

## 2020-07-29 NOTE — Progress Notes (Addendum)
Notified Dr. Chilton Si of pt's K+ is 5.6. No orders at this time.

## 2020-07-29 NOTE — Discharge Instructions (Addendum)
KEEP BANDAGE CLEAN AND DRY CALL OFFICE FOR F/U APPT [phone] in 6 days KEEP HAND ELEVATED ABOVE HEART OK TO APPLY ICE TO OPERATIVE AREA CONTACT OFFICE IF ANY WORSENING PAIN OR CONCERNS.  

## 2020-07-30 ENCOUNTER — Encounter (HOSPITAL_COMMUNITY): Payer: Self-pay | Admitting: Orthopedic Surgery

## 2020-08-03 LAB — AEROBIC/ANAEROBIC CULTURE W GRAM STAIN (SURGICAL/DEEP WOUND)

## 2022-08-13 IMAGING — DX DG FINGER INDEX 2+V*R*
3 series · 3 of 3 positions shown · non-contrast
Comparison: None.

CLINICAL DATA: Laceration with swelling and drainage. Injury 1 week
ago.

EXAM:
RIGHT INDEX FINGER 2+V

[finger ap]
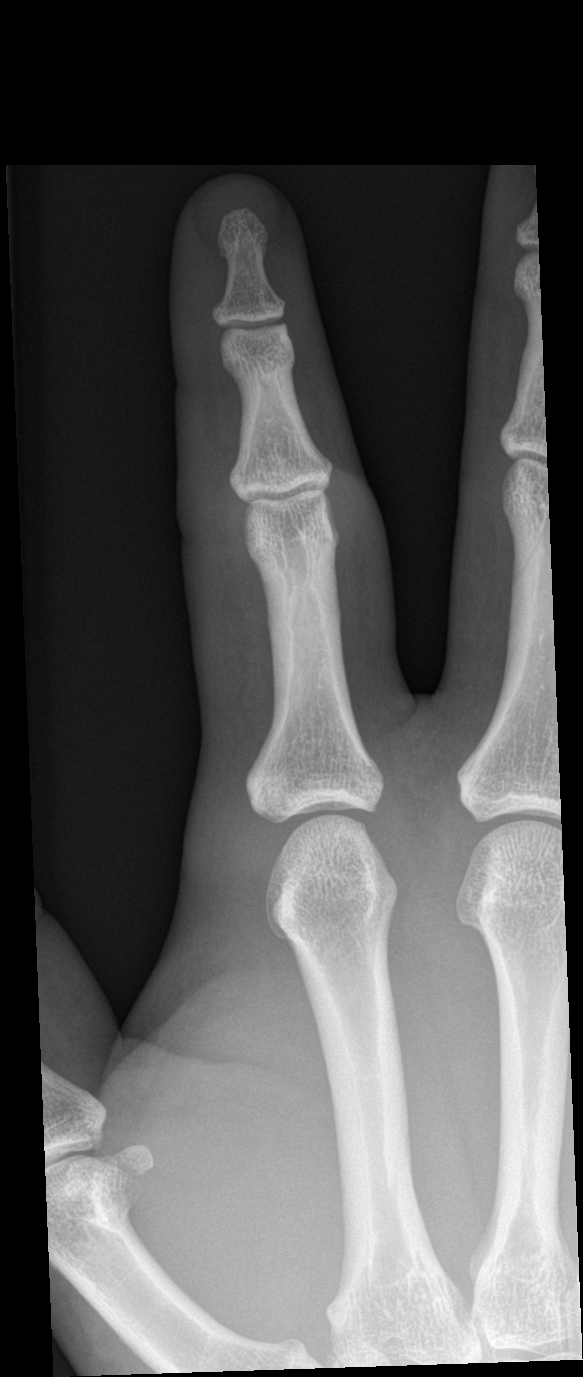

[finger obl]
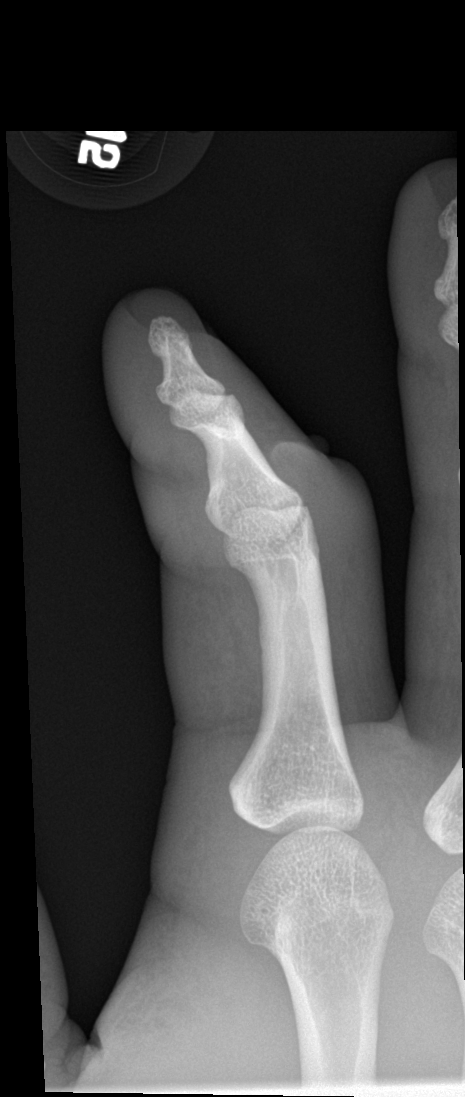

[finger lat]
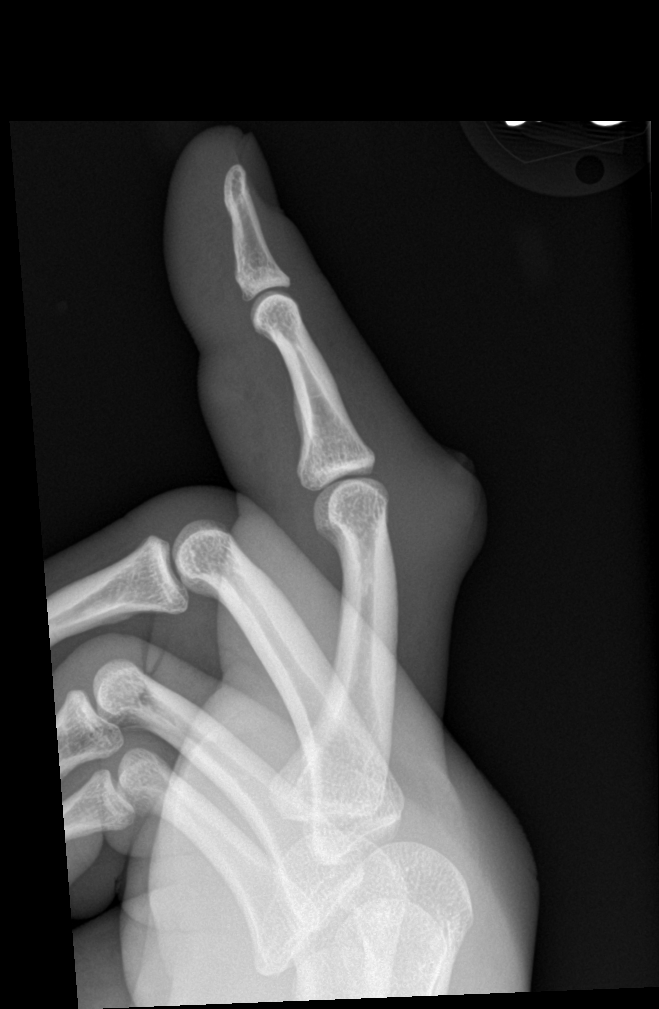

[3 of 3 positions shown; findings below may reference images not displayed]

FINDINGS: Pronounced dorsal soft tissue swelling particularly in the region of
the PIP joint. No sign of radiopaque foreign object. No bone
abnormality visible.
IMPRESSION: Pronounced dorsal soft tissue swelling particularly in the region of
the PIP joint. No sign of radiopaque foreign object or fracture.

## 2024-03-04 ENCOUNTER — Ambulatory Visit (INDEPENDENT_AMBULATORY_CARE_PROVIDER_SITE_OTHER): Payer: Self-pay

## 2024-03-04 ENCOUNTER — Ambulatory Visit
Admission: EM | Admit: 2024-03-04 | Discharge: 2024-03-04 | Disposition: A | Discharge status: Home / Self Care | Payer: Self-pay | Source: Home / Self Care

## 2024-03-04 ENCOUNTER — Ambulatory Visit: Payer: Self-pay | Admitting: Nurse Practitioner

## 2024-03-04 DIAGNOSIS — J209 Acute bronchitis, unspecified: Secondary | ICD-10-CM

## 2024-03-04 DIAGNOSIS — R051 Acute cough: Secondary | ICD-10-CM

## 2024-03-04 MED ORDER — AZITHROMYCIN 250 MG PO TABS: 250.0000 mg | ORAL_TABLET | Freq: Every day | ORAL | 0 refills | Status: AC

## 2024-03-04 MED ORDER — PREDNISONE 20 MG PO TABS: 40.0000 mg | ORAL_TABLET | Freq: Every day | ORAL | 0 refills | Status: AC

## 2024-03-04 NOTE — ED Triage Notes (Addendum)
 Pt present with c/o a chest congestion. Pt states last Monday he woke up with a  fever. Pt states he had body aches, that has went away. Pt states he wake up with coughing fits. Pt states he smoke cigarettes and is int he process of quitting. Home interventions: mucinex, Dayquil, acetaminophen. Pt states he had 4 negative COVID test at home.

## 2024-03-04 NOTE — Discharge Instructions (Addendum)
 Start azithromycin  antibiotic as prescribed.  You may take prednisone  daily for 5 days as well.  Continue over-the-counter cough medicine as needed.  Lots of rest and fluids.  Please follow-up with your PCP in 2 to 3 days for recheck.  Please go to the ER for any worsening symptoms.  Hope you feel better soon!

## 2024-03-04 NOTE — ED Provider Notes (Signed)
 " [facility]    CSN: [id number] Arrival date & time: 03/04/24  1014      History   Chief Complaint Chief Complaint  Patient presents with   Fever   Chest Congestion    HPI Daniel Galvan is a 39 y.o. male  presents for evaluation of URI symptoms for 7 days. Patient reports associated symptoms of cough with purulent sputum and low-grade fevers.  Body aches but those have since resolved.  Denies N/V/D, sore throat, ear pain, shortness of breath. Patient does not have a hx of asthma. Patient is an active smoker.   Reports no known sick contacts but does work with the public.  Pt has taken cold medicine OTC for symptoms.  Reports 4 negative home COVID test.  Pt has no other concerns at this time.    Fever Associated symptoms: congestion and cough     Past Medical History:  Diagnosis Date   Asthma     There are no active problems to display for this patient.   Past Surgical History:  Procedure Laterality Date   INCISION AND DRAINAGE Right 07/29/2020   Procedure: Right index finger incision and drainage and bone curettage;  Surgeon: Shari Easter, MD;  Location: [facility] OR;  Service: Orthopedics;  Laterality: Right;   NO PAST SURGERIES         Home Medications    Prior to Admission medications  Medication Sig Start Date End Date Taking? Authorizing Provider  azithromycin  (ZITHROMAX ) 250 MG tablet Take 1 tablet (250 mg total) by mouth daily. Take first 2 tablets together, then 1 every day until finished. 03/04/24  Yes Meaghen Vecchiarelli, Jodi R, NP  predniSONE  (DELTASONE ) 20 MG tablet Take 2 tablets (40 mg total) by mouth daily with breakfast for 5 days. 03/04/24 03/09/24 Yes Lorie Cleckley, Jodi R, NP  ibuprofen (ADVIL) 200 MG tablet Take 600 mg by mouth every 8 (eight) hours as needed (pain).    [provider]  sulfamethoxazole-trimethoprim (BACTRIM DS) 800-160 MG tablet Take 1 tablet by mouth 2 (two) times daily.    [provider]    Family History History reviewed.  No pertinent family history.  Social History Social History[1]   Allergies   Patient has no known allergies.   Review of Systems Review of Systems  Constitutional:  Positive for fever.  HENT:  Positive for congestion.   Respiratory:  Positive for cough.      Physical Exam Triage Vital Signs ED Triage Vitals  Encounter Vitals Group     BP 03/04/24 1113 123/80     Girls Systolic BP Percentile --      Girls Diastolic BP Percentile --      Boys Systolic BP Percentile --      Boys Diastolic BP Percentile --      Pulse Rate 03/04/24 1111 94     Resp 03/04/24 1111 16     Temp 03/04/24 1111 99.3 F (37.4 C)     Temp src --      SpO2 03/04/24 1111 96 %     Weight --      Height --      Head Circumference --      Peak Flow --      Pain Score 03/04/24 1110 0     Pain Loc --      Pain Education --      Exclude from Growth Chart --    No data found.  Updated Vital Signs BP 123/80  Pulse 94   Temp 99.3 F (37.4 C)   Resp 16   SpO2 96%   Visual Acuity Right Eye Distance:   Left Eye Distance:   Bilateral Distance:    Right Eye Near:   Left Eye Near:    Bilateral Near:     Physical Exam Vitals and nursing note reviewed.  Constitutional:      General: He is not in acute distress.    Appearance: Normal appearance. He is not ill-appearing or toxic-appearing.  HENT:     Head: Normocephalic and atraumatic.     Right Ear: Tympanic membrane and ear canal normal.     Left Ear: Tympanic membrane and ear canal normal.     Nose: Congestion present.     Mouth/Throat:     Mouth: Mucous membranes are moist.     Pharynx: No oropharyngeal exudate or posterior oropharyngeal erythema.  Eyes:     Pupils: Pupils are equal, round, and reactive to light.  Cardiovascular:     Rate and Rhythm: Normal rate and regular rhythm.     Heart sounds: Normal heart sounds.  Pulmonary:     Effort: Pulmonary effort is normal.     Breath sounds: Normal breath sounds. No wheezing, rhonchi  or rales.  Musculoskeletal:     Cervical back: Normal range of motion and neck supple.  Lymphadenopathy:     Cervical: No cervical adenopathy.  Skin:    General: Skin is warm and dry.  Neurological:     General: No focal deficit present.     Mental Status: He is alert and oriented to person, place, and time.  Psychiatric:        Mood and Affect: Mood normal.        Behavior: Behavior normal.      UC Treatments / Results  Labs (all labs ordered are listed, but only abnormal results are displayed) Labs Reviewed - No data to display  EKG   Radiology No results found.  Procedures Procedures (including critical care time)  Medications Ordered in UC Medications - No data to display  Initial Impression / Assessment and Plan / UC Course  I have reviewed the triage vital signs and the nursing notes.  Pertinent labs & imaging results that were available during my care of the patient were reviewed by me and considered in my medical decision making (see chart for details).     Wet read of x-ray without obvious consolidation, will contact for any positive results based on radiology overread once available.  Will start azithromycin  and prednisone .  He can continue over-the-counter cough medicine as needed.  Encourage rest fluids and PCP follow-up 2 to 3 days for recheck.  ER precautions reviewed. Final Clinical Impressions(s) / UC Diagnoses   Final diagnoses:  Acute cough  Acute bronchitis, unspecified organism     Discharge Instructions      Start azithromycin  antibiotic as prescribed.  You may take prednisone  daily for 5 days as well.  Continue over-the-counter cough medicine as needed.  Lots of rest and fluids.  Please follow-up with your PCP in 2 to 3 days for recheck.  Please go to the ER for any worsening symptoms.  Hope you feel better soon!     ED Prescriptions     Medication Sig Dispense Auth. Provider   azithromycin  (ZITHROMAX ) 250 MG tablet Take 1 tablet (250  mg total) by mouth daily. Take first 2 tablets together, then 1 every day until finished. 6 tablet Zhoe Catania, Jodi  R, NP   predniSONE  (DELTASONE ) 20 MG tablet Take 2 tablets (40 mg total) by mouth daily with breakfast for 5 days. 10 tablet Mckinze Poirier, Jodi R, NP      PDMP not reviewed this encounter.     [1]  Social History Tobacco Use   Smoking status: Every Day    Current packs/day: 0.26    Average packs/day: 0.3 packs/day for 15.0 years (3.9 ttl pk-yrs)    Types: Cigarettes   Smokeless tobacco: Never  Vaping Use   Vaping status: Never Used  Substance Use Topics   Alcohol use: Yes    Comment: rarely    Drug use: Not Currently     Loreda Myla SAUNDERS, NP 03/04/24 1149  "
